# Patient Record
Sex: Male | Born: 1970 | Race: Black or African American | Hispanic: No | Smoking: Current every day smoker
Health system: Southern US, Community
[De-identification: ages and names within clinical notes are randomized; demographics above are authoritative.]

## PROBLEM LIST (undated history)

## (undated) DIAGNOSIS — K219 Gastro-esophageal reflux disease without esophagitis: Secondary | ICD-10-CM

---

## 2016-05-11 ENCOUNTER — Ambulatory Visit (INDEPENDENT_AMBULATORY_CARE_PROVIDER_SITE_OTHER): Payer: Worker's Compensation

## 2016-05-11 ENCOUNTER — Ambulatory Visit (INDEPENDENT_AMBULATORY_CARE_PROVIDER_SITE_OTHER): Payer: Worker's Compensation | Admitting: Urgent Care

## 2016-05-11 VITALS — BP 128/78 | HR 67 | Temp 98.3°F | Ht 71.0 in | Wt 174.0 lb

## 2016-05-11 DIAGNOSIS — M79671 Pain in right foot: Secondary | ICD-10-CM | POA: Diagnosis not present

## 2016-05-11 DIAGNOSIS — S9031XA Contusion of right foot, initial encounter: Secondary | ICD-10-CM | POA: Diagnosis not present

## 2016-05-11 DIAGNOSIS — S99921A Unspecified injury of right foot, initial encounter: Secondary | ICD-10-CM

## 2016-05-11 DIAGNOSIS — M25474 Effusion, right foot: Secondary | ICD-10-CM

## 2016-05-11 DIAGNOSIS — Z026 Encounter for examination for insurance purposes: Secondary | ICD-10-CM

## 2016-05-11 MED ORDER — NAPROXEN SODIUM 550 MG PO TABS: 550.0000 mg | ORAL_TABLET | Freq: Two times a day (BID) | ORAL | 1 refills | Status: DC

## 2016-05-11 NOTE — Progress Notes (Signed)
    MRN: [id number] DOB: 12/17/1970  Subjective:   Calvin Bernard is a 46 y.o. male presenting for chief complaint of Foot Injury (right, x 3 days)  Reports 3 day history of right foot injury while at work. 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 However, it fell onto his right foot. He has since had pain, swelling, difficulty bearing weight. He has used some wrapping to help with the pain. Denies bony deformity, bruising.   Savir has a current medications list, allergies, pmh, psh that were updated as appropriate and not included due to being a worker's compensation claim.  Objective:   Vitals: BP 128/78 (BP Location: Right Arm, Patient Position: Sitting, Cuff Size: Normal)   Pulse 67   Temp 98.3 F (36.8 C) (Oral)   Ht 5\' 11"  (1.803 m)   Wt 174 lb (78.9 kg)   SpO2 98%   BMI 24.27 kg/m   Physical Exam  Constitutional: He is oriented to person, place, and time. He appears well-developed and well-nourished.  Cardiovascular: Normal rate.   Pulmonary/Chest: Effort normal.  Musculoskeletal:       Right foot: There is decreased range of motion (dorsi and planter flexion of his foot), tenderness (over area depicted) and swelling (trace edema over area depicted). There is no bony tenderness, normal capillary refill, no crepitus, no deformity and no laceration.       Feet:  Neurological: He is alert and oriented to person, place, and time.   Dg Foot Complete Right  Result Date: 05/11/2016 CLINICAL DATA:  Right foot injury.  No time course given. EXAM: RIGHT FOOT COMPLETE - 3+ VIEW COMPARISON:  None FINDINGS: The joint spaces are maintained. No acute bony findings or significant degenerative changes. IMPRESSION: No acute bony findings. Electronically Signed   By: Rudie MeyerP.  Gallerani M.D.   On: 05/11/2016 17:09   Assessment and Plan :   1. Encounter related to worker's compensation claim 2. Contusion of right foot, initial encounter 3. Injury of right  foot, initial encounter 4. Swelling of foot joint, right 5. Right foot pain - Will manage conservatively. Work restrictions provided. RTC in 1 week for f/u.  Wallis BambergMario Vasiliki Smaldone, PA-C [facility] [phone] 05/11/2016 4:38 PM

## 2016-05-11 NOTE — Patient Instructions (Addendum)
Contusion A contusion is a deep bruise. Contusions are the result of a blunt injury to tissues and muscle fibers under the skin. The injury causes bleeding under the skin. The skin overlying the contusion may turn blue, purple, or yellow. Minor injuries will give you a painless contusion, but more severe contusions may stay painful and swollen for a few weeks. What are the causes? This condition is usually caused by a blow, trauma, or direct force to an area of the body. What are the signs or symptoms? Symptoms of this condition include:  Swelling of the injured area.  Pain and tenderness in the injured area.  Discoloration. The area may have redness and then turn blue, purple, or yellow.  How is this diagnosed? This condition is diagnosed based on a physical exam and medical history. An X-ray, CT scan, or MRI may be needed to determine if there are any associated injuries, such as broken bones (fractures). How is this treated? Specific treatment for this condition depends on what area of the body was injured. In general, the best treatment for a contusion is resting, icing, applying pressure to (compression), and elevating the injured area. This is often called the RICE strategy. Over-the-counter anti-inflammatory medicines may also be recommended for pain control. Follow these instructions at home:  Rest the injured area.  If directed, apply ice to the injured area: ? Put ice in a plastic bag. ? Place a towel between your skin and the bag. ? Leave the ice on for 20 minutes, 2-3 times per day.  If directed, apply light compression to the injured area using an elastic bandage. Make sure the bandage is not wrapped too tightly. Remove and reapply the bandage as directed by your health care provider.  If possible, raise (elevate) the injured area above the level of your heart while you are sitting or lying down.  Take over-the-counter and prescription medicines only as told by your health  care provider. Contact a health care provider if:  Your symptoms do not improve after several days of treatment.  Your symptoms get worse.  You have difficulty moving the injured area. Get help right away if:  You have severe pain.  You have numbness in a hand or foot.  Your hand or foot turns pale or cold. This information is not intended to replace advice given to you by your health care provider. Make sure you discuss any questions you have with your health care provider. Document Released: 02/03/2005 Document Revised: 09/04/2015 Document Reviewed: 09/11/2014 Elsevier Interactive Patient Education  2017 Elsevier Inc.     IF you received an x-ray today, you will receive an invoice from [facility]. Please contact [facility] at [phone] with questions or concerns regarding your invoice.   IF you received labwork today, you will receive an invoice from [facility]. Please contact [facility] at [phone] with questions or concerns regarding your invoice.   Our billing staff will not be able to assist you with questions regarding bills from these companies.  You will be contacted with the lab results as soon as they are available. The fastest way to get your results is to activate your My Chart account. Instructions are located on the last page of this paperwork. If you have not heard from us regarding the results in 2 weeks, please contact this office.      

## 2016-05-19 ENCOUNTER — Ambulatory Visit (INDEPENDENT_AMBULATORY_CARE_PROVIDER_SITE_OTHER): Payer: Worker's Compensation | Admitting: Urgent Care

## 2016-05-19 VITALS — BP 128/80 | HR 67 | Temp 98.7°F | Resp 17 | Ht 71.5 in | Wt 174.0 lb

## 2016-05-19 DIAGNOSIS — M25474 Effusion, right foot: Secondary | ICD-10-CM

## 2016-05-19 DIAGNOSIS — M79671 Pain in right foot: Secondary | ICD-10-CM | POA: Diagnosis not present

## 2016-05-19 DIAGNOSIS — S9031XA Contusion of right foot, initial encounter: Secondary | ICD-10-CM

## 2016-05-19 DIAGNOSIS — S9031XD Contusion of right foot, subsequent encounter: Secondary | ICD-10-CM | POA: Diagnosis not present

## 2016-05-19 DIAGNOSIS — Z026 Encounter for examination for insurance purposes: Secondary | ICD-10-CM

## 2016-05-19 MED ORDER — NAPROXEN SODIUM 550 MG PO TABS: 550.0000 mg | ORAL_TABLET | Freq: Two times a day (BID) | ORAL | 1 refills | Status: DC

## 2016-05-19 NOTE — Patient Instructions (Addendum)
If you are unable to wear your work shoe or cannot tolerate your pain tomorrow after a work a shift, then come back for a recheck.   Contusion Introduction A contusion is a deep bruise. Contusions happen when an injury causes bleeding under the skin. Symptoms of bruising include pain, swelling, and discolored skin. The skin may turn blue, purple, or yellow. Follow these instructions at home:  Rest the injured area.  If told, put ice on the injured area.  Put ice in a plastic bag.  Place a towel between your skin and the bag.  Leave the ice on for 20 minutes, 2-3 times per day.  If told, put light pressure (compression) on the injured area using an elastic bandage. Make sure the bandage is not too tight. Remove it and put it back on as told by your doctor.  If possible, raise (elevate) the injured area above the level of your heart while you are sitting or lying down.  Take over-the-counter and prescription medicines only as told by your doctor. Contact a doctor if:  Your symptoms do not get better after several days of treatment.  Your symptoms get worse.  You have trouble moving the injured area. Get help right away if:  You have very bad pain.  You have a loss of feeling (numbness) in a hand or foot.  Your hand or foot turns pale or cold. This information is not intended to replace advice given to you by your health care provider. Make sure you discuss any questions you have with your health care provider. Document Released: 10/13/2007 Document Revised: 10/02/2015 Document Reviewed: 09/11/2014  2017 Elsevier    IF you received an x-ray today, you will receive an invoice from [facility]. Please contact [facility] at [phone] with questions or concerns regarding your invoice.   IF you received labwork today, you will receive an invoice from [facility]. Please contact [facility] at [phone] with questions or concerns regarding your invoice.   Our  billing staff will not be able to assist you with questions regarding bills from these companies.  You will be contacted with the lab results as soon as they are available. The fastest way to get your results is to activate your My Chart account. Instructions are located on the last page of this paperwork. If you have not heard from us regarding the results in 2 weeks, please contact this office.

## 2016-05-19 NOTE — Progress Notes (Signed)
    MRN: [id number] DOB: 10/01/1970  Subjective:   Calvin Bernard is a 46 y.o. male presenting for worker's compensation follow up visit. His x-ray was negative on 05/11/2016, managed for foot contusion conservatively with work restrictions. Has not tried Anaprox. Admits that he has been resting his foot and trying to maneuver his foot the way he would at work but has been unsuccessful. He also cannot put his foot into his work shoe due to swelling. He now feels tightness in his arch and right hip. Denies trauma, redness, popping sensations, bony deformity.   Cyrus's medications list, allergies, past medical history and past surgical history were reviewed and excluded from this note due to being a worker's comp case.  Objective:   Vitals: BP 128/80 (BP Location: Right Arm, Patient Position: Sitting, Cuff Size: Normal)   Pulse 67   Temp 98.7 F (37.1 C) (Oral)   Resp 17   Ht 5' 11.5" (1.816 m)   Wt 174 lb (78.9 kg)   SpO2 98%   BMI 23.93 kg/m   Physical Exam  Constitutional: He is oriented to person, place, and time. He appears well-developed and well-nourished.  Cardiovascular: Normal rate.   Pulmonary/Chest: Effort normal.  Musculoskeletal:       Right foot: There is tenderness (over area outlined) and swelling. There is normal range of motion, no bony tenderness, normal capillary refill, no crepitus, no deformity and no laceration.       Feet:  Right foot measures 23.4cm across first metatarsal and 22.3cm for left foot.  Neurological: He is alert and oriented to person, place, and time.   Dg Foot Complete Right  Result Date: 05/11/2016 CLINICAL DATA:  Right foot injury.  No time course given. EXAM: RIGHT FOOT COMPLETE - 3+ VIEW COMPARISON:  None FINDINGS: The joint spaces are maintained. No acute bony findings or significant degenerative changes. IMPRESSION: No acute bony findings. Electronically Signed   By: Rudie MeyerP.  Gallerani M.D.   On: 05/11/2016 17:09   Assessment and Plan :   1.  Encounter related to worker's compensation claim 2. Contusion of right foot, initial encounter 3. Right foot pain 4. Swelling of foot joint, right - Start Anaprox for foot pain and swelling as recommended previously, patient agreed to pick up his script. Work restrictions written for half days. Patient will f/u on 05/22/2016 or sooner if he is unable to tolerate half day at work or use his work shoe.  Wallis BambergMario Ceazia Harb, PA-C [facility] [phone] 05/19/2016 2:31 PM

## 2016-05-20 ENCOUNTER — Ambulatory Visit (INDEPENDENT_AMBULATORY_CARE_PROVIDER_SITE_OTHER): Payer: Worker's Compensation | Admitting: Urgent Care

## 2016-05-20 VITALS — BP 118/64 | HR 66 | Temp 98.3°F | Resp 14 | Ht 71.5 in | Wt 177.0 lb

## 2016-05-20 DIAGNOSIS — Z026 Encounter for examination for insurance purposes: Secondary | ICD-10-CM

## 2016-05-20 DIAGNOSIS — M25474 Effusion, right foot: Secondary | ICD-10-CM

## 2016-05-20 DIAGNOSIS — S9031XD Contusion of right foot, subsequent encounter: Secondary | ICD-10-CM | POA: Diagnosis not present

## 2016-05-20 DIAGNOSIS — M79671 Pain in right foot: Secondary | ICD-10-CM

## 2016-05-20 DIAGNOSIS — S9031XA Contusion of right foot, initial encounter: Secondary | ICD-10-CM

## 2016-05-20 MED ORDER — MELOXICAM 15 MG PO TABS: 7.5000 mg | ORAL_TABLET | Freq: Every day | ORAL | 1 refills | Status: DC

## 2016-05-20 NOTE — Patient Instructions (Addendum)
Use Tylenol 500mg  tonight for pain and inflammation. Try 7.5-15mg  meloxicam based on your pain level and side effects. If you end up needing 15mg  but have already taken 7.5mg , wait til the next day to use 15mg  and use 500mg  of Tylenol that night.  Meloxicam tablets What is this medicine? MELOXICAM (mel OX i cam) is a non-steroidal anti-inflammatory drug (NSAID). It is used to reduce swelling and to treat pain. It may be used for osteoarthritis, rheumatoid arthritis, or juvenile rheumatoid arthritis. This medicine may be used for other purposes; ask your health care provider or pharmacist if you have questions. COMMON BRAND NAME(S): Mobic What should I tell my health care provider before I take this medicine? They need to know if you have any of these conditions: -bleeding disorders -cigarette smoker -coronary artery bypass graft (CABG) surgery within the past 2 weeks -drink more than 3 alcohol-containing drinks per day -heart disease -high blood pressure -history of stomach bleeding -kidney disease -liver disease -lung or breathing disease, like asthma -stomach or intestine problems -an unusual or allergic reaction to meloxicam, aspirin, other NSAIDs, other medicines, foods, dyes, or preservatives -pregnant or trying to get pregnant -breast-feeding How should I use this medicine? Take this medicine by mouth with a full glass of water. Follow the directions on the prescription label. You can take it with or without food. If it upsets your stomach, take it with food. Take your medicine at regular intervals. Do not take it more often than directed. Do not stop taking except on your doctor's advice. A special MedGuide will be given to you by the pharmacist with each prescription and refill. Be sure to read this information carefully each time. Talk to your pediatrician regarding the use of this medicine in children. While this drug may be prescribed for selected conditions, precautions do  apply. Patients over 36 years old may have a stronger reaction and need a smaller dose. Overdosage: If you think you have taken too much of this medicine contact a poison control center or emergency room at once. NOTE: This medicine is only for you. Do not share this medicine with others. What if I miss a dose? If you miss a dose, take it as soon as you can. If it is almost time for your next dose, take only that dose. Do not take double or extra doses. What may interact with this medicine? Do not take this medicine with any of the following medications: -cidofovir -ketorolac This medicine may also interact with the following medications: -aspirin and aspirin-like medicines -certain medicines for blood pressure, heart disease, irregular heart beat -certain medicines for depression, anxiety, or psychotic disturbances -certain medicines that treat or prevent blood clots like warfarin, enoxaparin, dalteparin, apixaban, dabigatran, rivaroxaban -cyclosporine -digoxin -diuretics -methotrexate -other NSAIDs, medicines for pain and inflammation, like ibuprofen and naproxen -pemetrexed This list may not describe all possible interactions. Give your health care provider a list of all the medicines, herbs, non-prescription drugs, or dietary supplements you use. Also tell them if you smoke, drink alcohol, or use illegal drugs. Some items may interact with your medicine. What should I watch for while using this medicine? Tell your doctor or healthcare professional if your symptoms do not start to get better or if they get worse. Do not take other medicines that contain aspirin, ibuprofen, or naproxen with this medicine. Side effects such as stomach upset, nausea, or ulcers may be more likely to occur. Many medicines available without a prescription should not be taken with  this medicine. This medicine can cause ulcers and bleeding in the stomach and intestines at any time during treatment. This can  happen with no warning and may cause death. There is increased risk with taking this medicine for a long time. Smoking, drinking alcohol, older age, and poor health can also increase risks. Call your doctor right away if you have stomach pain or blood in your vomit or stool. This medicine does not prevent heart attack or stroke. In fact, this medicine may increase the chance of a heart attack or stroke. The chance may increase with longer use of this medicine and in people who have heart disease. If you take aspirin to prevent heart attack or stroke, talk with your doctor or health care professional. What side effects may I notice from receiving this medicine? Side effects that you should report to your doctor or health care professional as soon as possible: -allergic reactions like skin rash, itching or hives, swelling of the face, lips, or tongue -nausea, vomiting -signs and symptoms of a blood clot such as breathing problems; changes in vision; chest pain; severe, sudden headache; pain, swelling, warmth in the leg; trouble speaking; sudden numbness or weakness of the face, arm, or leg -signs and symptoms of bleeding such as bloody or black, tarry stools; red or dark-brown urine; spitting up blood or brown material that looks like coffee grounds; red spots on the skin; unusual bruising or bleeding from the eye, gums, or nose -signs and symptoms of liver injury like dark yellow or brown urine; general ill feeling or flu-like symptoms; light-colored stools; loss of appetite; nausea; right upper belly pain; unusually weak or tired; yellowing of the eyes or skin -signs and symptoms of stroke like changes in vision; confusion; trouble speaking or understanding; severe headaches; sudden numbness or weakness of the face, arm, or leg; trouble walking; dizziness; loss of balance or coordination Side effects that usually do not require medical attention (report to your doctor or health care professional if they  continue or are bothersome): -constipation -diarrhea -gas This list may not describe all possible side effects. Call your doctor for medical advice about side effects. You may report side effects to FDA at 1-800-FDA-1088. Where should I keep my medicine? Keep out of the reach of children. Store at room temperature between 15 and 30 degrees C (59 and 86 degrees F). Throw away any unused medicine after the expiration date. NOTE: This sheet is a summary. It may not cover all possible information. If you have questions about this medicine, talk to your doctor, pharmacist, or health care provider.  2017 Elsevier/Gold Standard (2015-05-28 19:28:16)     IF you received an x-ray today, you will receive an invoice from [facility]. Please contact [facility] at [phone] with questions or concerns regarding your invoice.   IF you received labwork today, you will receive an invoice from [facility]. Please contact [facility] at [phone] with questions or concerns regarding your invoice.   Our billing staff will not be able to assist you with questions regarding bills from these companies.  You will be contacted with the lab results as soon as they are available. The fastest way to get your results is to activate your My Chart account. Instructions are located on the last page of this paperwork. If you have not heard from [place] regarding the results in 2 weeks, please contact this office.

## 2016-05-20 NOTE — Progress Notes (Signed)
    MRN: [id number] DOB: 09/12/1970  Subjective:   Calvin Bernard is a 46 y.o. male presenting for follow up on right foot contusion. He was last seen yesterday 05/19/2016, started Anaprox. He went to work today with some light duties. 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 Otherwise, he used the [place] with ease. Lifting heavy items also elicited some foot pain. However, since starting Anaprox, has taken 2 doses, he has had left eye twitching. Denies blurred vision, photosensitivity, rashes. Denies weakness of his foot, redness, warmth.  Calvin Bernard has current medications list, allergies, pmh, psh that were updated as appropriate and not included due to being a worker's compensation claim.   Objective:   Vitals: BP 118/64   Pulse 66   Temp 98.3 F (36.8 C) (Oral)   Resp 14   Ht 5' 11.5" (1.816 m)   Wt 177 lb (80.3 kg)   SpO2 98%   BMI 24.34 kg/m   Physical Exam  Constitutional: He is oriented to person, place, and time. He appears well-developed and well-nourished.  Cardiovascular: Normal rate.   Pulmonary/Chest: Effort normal.  Musculoskeletal:       Right foot: There is decreased range of motion (dorsiflexion), tenderness and swelling (trace). There is no bony tenderness, normal capillary refill, no crepitus, no deformity and no laceration.       Feet:  Distal portion of foot around 1st metatarsal where patient has pain measures 22.7cm for left and 23.2cm for right.  Neurological: He is alert and oriented to person, place, and time.   Assessment and Plan :   1. Encounter related to worker's compensation claim 2. Contusion of right foot, initial encounter 3. Right foot pain 4. Swelling of foot joint, right - Stop Anaprox. Use APAP and meloxicam. I will maintain patient's 1/2 work schedule. Recheck in 1 week or sooner if patient's pain persists.  Wallis BambergMario Nanda Bittick, PA-C [facility] [phone] 05/20/2016 5:14 PM

## 2016-05-25 ENCOUNTER — Telehealth: Payer: Self-pay | Admitting: Family Medicine

## 2016-05-25 NOTE — Telephone Encounter (Signed)
SOMEONE FROM WORK[employer] INS[employer] WANTED PT DRUG SCREEN RESULTS AND A COPY OF LAST LETTER TO RETURN TO WORK

## 2016-06-07 ENCOUNTER — Ambulatory Visit (INDEPENDENT_AMBULATORY_CARE_PROVIDER_SITE_OTHER): Payer: Worker's Compensation | Admitting: Emergency Medicine

## 2016-06-07 ENCOUNTER — Ambulatory Visit (INDEPENDENT_AMBULATORY_CARE_PROVIDER_SITE_OTHER): Payer: Worker's Compensation

## 2016-06-07 VITALS — BP 92/60 | HR 66 | Temp 98.1°F | Resp 18 | Ht 71.5 in | Wt 175.0 lb

## 2016-06-07 DIAGNOSIS — M79672 Pain in left foot: Secondary | ICD-10-CM

## 2016-06-07 DIAGNOSIS — S99922A Unspecified injury of left foot, initial encounter: Secondary | ICD-10-CM

## 2016-06-07 DIAGNOSIS — S9032XA Contusion of left foot, initial encounter: Secondary | ICD-10-CM

## 2016-06-07 NOTE — Patient Instructions (Signed)
Foot Pain Introduction Many things can cause foot pain. Some common causes are:  An injury.  A sprain.  Arthritis.  Blisters.  Bunions. Follow these instructions at home: Pay attention to any changes in your symptoms. Take these actions to help with your discomfort:  If directed, put ice on the affected area:  Put ice in a plastic bag.  Place a towel between your skin and the bag.  Leave the ice on for 15-20 minutes, 3?4 times a day for 2 days.  Take over-the-counter and prescription medicines only as told by your health care provider.  Wear comfortable, supportive shoes that fit you well. Do not wear high heels.  Do not stand or walk for long periods of time.  Do not lift a lot of weight. This can put added pressure on your feet.  Do stretches to relieve foot pain and stiffness as told by your health care provider.  Rub your foot gently.  Keep your feet clean and dry. Contact a health care provider if:  Your pain does not get better after a few days of self-care.  Your pain gets worse.  You cannot stand on your foot. Get help right away if:  Your foot is numb or tingling.  Your foot or toes are swollen.  Your foot or toes turn white or blue.  You have warmth and redness along your foot. This information is not intended to replace advice given to you by your health care provider. Make sure you discuss any questions you have with your health care provider. Document Released: 05/23/2015 Document Revised: 10/02/2015 Document Reviewed: 05/22/2014  2017 Elsevier  

## 2016-06-07 NOTE — Progress Notes (Signed)
Calvin Bernard 46 y.o.   Chief Complaint  Patient presents with  . Follow-up    Workers comp    HISTORY OF PRESENT ILLNESS: This is a 46 y.o. male complaining of pain to left foot after sustaining injury at work.  Foot Injury   The incident occurred more than 1 week ago (1 month ago). The incident occurred at work. The injury mechanism was a direct blow. The pain is present in the left foot. The quality of the pain is described as aching. The pain is at a severity of 5/10. The pain is moderate. The pain has been constant since onset. Associated symptoms include an inability to bear weight. Pertinent negatives include no loss of sensation, muscle weakness, numbness or tingling. The symptoms are aggravated by movement and weight bearing. He has tried ice, acetaminophen and NSAIDs for the symptoms. The treatment provided mild relief.     Prior to Admission medications   Medication Sig Start Date End Date Taking? Authorizing Provider  meloxicam (MOBIC) 15 MG tablet Take 0.5-1 tablets (7.5-15 mg total) by mouth daily. 05/20/16  Yes Wallis BambergMario Mani, PA-C    No Known Allergies  There are no active problems to display for this patient.   History reviewed. No pertinent past medical history.  History reviewed. No pertinent surgical history.  Social History   Social History  . Marital status: Unknown    Spouse name: N/A  . Number of children: N/A  . Years of education: N/A   Occupational History  . Not on file.   Social History Main Topics  . Smoking status: Current Every Day Smoker  . Smokeless tobacco: Never Used  . Alcohol use Not on file  . Drug use: Unknown  . Sexual activity: Not on file   Other Topics Concern  . Not on file   Social History Narrative  . No narrative on file    History reviewed. No pertinent family history.   Review of Systems  Constitutional: Negative for chills and fever.  HENT: Negative.   Eyes: Negative.   Respiratory: Negative.     Cardiovascular: Negative.   Gastrointestinal: Negative for nausea and vomiting.  Musculoskeletal: Negative for back pain and neck pain.       Left foot pain  Skin: Negative for rash.  Neurological: Negative for dizziness, tingling, numbness and headaches.  Endo/Heme/Allergies: Does not bruise/bleed easily.  All other systems reviewed and are negative.  Vitals:   06/07/16 1417  BP: 92/60  Pulse: 66  Resp: 18  Temp: 98.1 F (36.7 C)     Physical Exam  Constitutional: He is oriented to person, place, and time. He appears well-developed and well-nourished.  HENT:  Head: Normocephalic and atraumatic.  Eyes: Pupils are equal, round, and reactive to light.  Neck: Normal range of motion. Neck supple.  Cardiovascular: Normal rate.   Pulmonary/Chest: Effort normal.  Musculoskeletal:  Left foot: +tenderness and mild swelling lateral aspect; NVI; ambulatory.  Neurological: He is alert and oriented to person, place, and time.  Skin: Skin is warm and dry. Capillary refill takes less than 2 seconds.  Psychiatric: He has a normal mood and affect. His behavior is normal.  Vitals reviewed.  Xray reviewed: no Fx  ASSESSMENT & PLAN: Calvin Bernard was seen today for follow-up.  Diagnoses and all orders for this visit:  Left foot pain -     Ambulatory referral to Podiatry  Contusion of left foot, initial encounter  Foot injury, left, initial encounter -  DG Foot Complete Left; Future    Patient Instructions  Foot Pain Introduction Many things can cause foot pain. Some common causes are:  An injury.  A sprain.  Arthritis.  Blisters.  Bunions. Follow these instructions at home: Pay attention to any changes in your symptoms. Take these actions to help with your discomfort:  If directed, put ice on the affected area:  Put ice in a plastic bag.  Place a towel between your skin and the bag.  Leave the ice on for 15-20 minutes, 3?4 times a day for 2 days.  Take  over-the-counter and prescription medicines only as told by your health care provider.  Wear comfortable, supportive shoes that fit you well. Do not wear high heels.  Do not stand or walk for long periods of time.  Do not lift a lot of weight. This can put added pressure on your feet.  Do stretches to relieve foot pain and stiffness as told by your health care provider.  Rub your foot gently.  Keep your feet clean and dry. Contact a health care provider if:  Your pain does not get better after a few days of self-care.  Your pain gets worse.  You cannot stand on your foot. Get help right away if:  Your foot is numb or tingling.  Your foot or toes are swollen.  Your foot or toes turn white or blue.  You have warmth and redness along your foot. This information is not intended to replace advice given to you by your health care provider. Make sure you discuss any questions you have with your health care provider. Document Released: 05/23/2015 Document Revised: 10/02/2015 Document Reviewed: 05/22/2014  2017 Elsevier      Edwina Barth, MD [facility]

## 2017-01-26 ENCOUNTER — Emergency Department (HOSPITAL_COMMUNITY): Payer: Self-pay

## 2017-01-26 ENCOUNTER — Encounter (HOSPITAL_COMMUNITY): Payer: Self-pay | Admitting: Emergency Medicine

## 2017-01-26 ENCOUNTER — Emergency Department (HOSPITAL_COMMUNITY)
Admission: EM | Admit: 2017-01-26 | Discharge: 2017-01-26 | Disposition: A | Discharge status: Home / Self Care | Payer: Self-pay | Attending: Emergency Medicine | Admitting: Emergency Medicine

## 2017-01-26 DIAGNOSIS — K2100 Gastro-esophageal reflux disease with esophagitis, without bleeding: Secondary | ICD-10-CM

## 2017-01-26 DIAGNOSIS — F17200 Nicotine dependence, unspecified, uncomplicated: Secondary | ICD-10-CM | POA: Insufficient documentation

## 2017-01-26 DIAGNOSIS — Z79899 Other long term (current) drug therapy: Secondary | ICD-10-CM | POA: Insufficient documentation

## 2017-01-26 HISTORY — DX: Gastro-esophageal reflux disease without esophagitis: K21.9

## 2017-01-26 LAB — COMPREHENSIVE METABOLIC PANEL
ALK PHOS: 61 U/L (ref 38–126)
ALT: 17 U/L (ref 17–63)
ANION GAP: 10 (ref 5–15)
AST: 25 U/L (ref 15–41)
Albumin: 3.9 g/dL (ref 3.5–5.0)
BUN: 9 mg/dL (ref 6–20)
CALCIUM: 8.9 mg/dL (ref 8.9–10.3)
CO2: 22 mmol/L (ref 22–32)
Chloride: 108 mmol/L (ref 101–111)
Creatinine, Ser: 1 mg/dL (ref 0.61–1.24)
GFR calc non Af Amer: >60 mL/min (ref >60)
Glucose, Bld: 109 mg/dL — ABNORMAL HIGH (ref 65–99)
Potassium: 3.4 mmol/L — ABNORMAL LOW (ref 3.5–5.1)
SODIUM: 140 mmol/L (ref 135–145)
TOTAL PROTEIN: 6.6 g/dL (ref 6.5–8.1)
Total Bilirubin: 0.9 mg/dL (ref 0.3–1.2)

## 2017-01-26 LAB — CBC
HCT: 45.2 % (ref 39.0–52.0)
HEMOGLOBIN: 16.6 g/dL (ref 13.0–17.0)
MCH: 29.8 pg (ref 26.0–34.0)
MCHC: 36.7 g/dL — ABNORMAL HIGH (ref 30.0–36.0)
MCV: 81.1 fL (ref 78.0–100.0)
Platelets: 132 10*3/uL — ABNORMAL LOW (ref 150–400)
RBC: 5.57 MIL/uL (ref 4.22–5.81)
RDW: 13.6 % (ref 11.5–15.5)
WBC: 13.3 10*3/uL — AB (ref 4.0–10.5)

## 2017-01-26 LAB — LIPASE, BLOOD: LIPASE: 30 U/L (ref 11–51)

## 2017-01-26 MED ORDER — SODIUM CHLORIDE 0.9 % IV SOLN
INTRAVENOUS | Status: DC
  Administered 2017-01-26: 125 mL/h via INTRAVENOUS

## 2017-01-26 MED ORDER — ONDANSETRON HCL 4 MG/2ML IJ SOLN
4.0000 mg | Freq: Once | INTRAMUSCULAR | Status: AC
  Administered 2017-01-26: 4 mg via INTRAVENOUS
  Filled 2017-01-26: qty 2

## 2017-01-26 MED ORDER — SUCRALFATE 1 G PO TABS: 1.0000 g | ORAL_TABLET | Freq: Four times a day (QID) | ORAL | 0 refills | Status: AC

## 2017-01-26 MED ORDER — ONDANSETRON 4 MG PO TBDP
4.0000 mg | ORAL_TABLET | Freq: Once | ORAL | Status: AC | PRN
  Administered 2017-01-26: 4 mg via ORAL
  Filled 2017-01-26: qty 1

## 2017-01-26 MED ORDER — IOPAMIDOL (ISOVUE-300) INJECTION 61%
INTRAVENOUS | Status: AC
  Filled 2017-01-26: qty 100

## 2017-01-26 MED ORDER — FAMOTIDINE 40 MG PO TABS: 40.0000 mg | ORAL_TABLET | Freq: Every day | ORAL | 0 refills | Status: AC

## 2017-01-26 MED ORDER — LORAZEPAM 2 MG/ML IJ SOLN
0.5000 mg | Freq: Once | INTRAMUSCULAR | Status: AC
  Administered 2017-01-26: 0.5 mg via INTRAVENOUS
  Filled 2017-01-26: qty 1

## 2017-01-26 MED ORDER — SODIUM CHLORIDE 0.9 % IV BOLUS (SEPSIS)
2000.0000 mL | Freq: Once | INTRAVENOUS | Status: AC
  Administered 2017-01-26: 2000 mL via INTRAVENOUS

## 2017-01-26 MED ORDER — IOPAMIDOL (ISOVUE-300) INJECTION 61%
100.0000 mL | Freq: Once | INTRAVENOUS | Status: AC | PRN
Start: 2017-01-26 — End: 2017-01-26
  Administered 2017-01-26: 100 mL via INTRAVENOUS

## 2017-01-26 MED ORDER — MORPHINE SULFATE (PF) 4 MG/ML IV SOLN
4.0000 mg | Freq: Once | INTRAVENOUS | Status: AC
  Administered 2017-01-26: 4 mg via INTRAVENOUS
  Filled 2017-01-26: qty 1

## 2017-01-26 NOTE — ED Triage Notes (Signed)
Per [facility] patient comes in for abd pain, n/v/d since been woke up this morning around 6am with these symptoms. Patient reports eating chicken parm and having acid reflux from acid in tomatoes.  Patient had one episode of Loose. Patient requesting, "i need something to make me throw up." informed patient that I can given him medication (ie Zofran) to help with nausea and vomiting.

## 2017-01-26 NOTE — ED Provider Notes (Signed)
[facility]-EMERGENCY DEPT Provider Note   CSN: [id number] Arrival date & time: 01/26/17  0930     History   Chief Complaint Chief Complaint  Patient presents with  . Abdominal Pain  . Emesis  . Diarrhea    HPI Calvin Bernard is a 46 y.o. male.  46 year old male presents with acute onset of epigastric and left upper quadrant abdominal pain that began early this morning. Does have a history of acid reflux and did have chickenpox on last night. He has had yellow emesis 3 today. No fever or chills. No black stools. No prior history of same. Pain is burning and does radiate to his back and is worse with any movement. Has not tried to eat since the symptoms started. Denies any urinary symptoms currently.      Past Medical History:  Diagnosis Date  . Acid reflux     There are no active problems to display for this patient.   History reviewed. No pertinent surgical history.     Home Medications    Prior to Admission medications   Medication Sig Start Date End Date Taking? Authorizing Provider  meloxicam (MOBIC) 15 MG tablet Take 0.5-1 tablets (7.5-15 mg total) by mouth daily. 05/20/16   Wallis Bamberg, PA-C    Family History No family history on file.  Social History Social History  Substance Use Topics  . Smoking status: Current Every Day Smoker  . Smokeless tobacco: Never Used  . Alcohol use No     Allergies   Patient has no known allergies.   Review of Systems Review of Systems  All other systems reviewed and are negative.    Physical Exam Updated Vital Signs There were no vitals taken for this visit.  Physical Exam  Constitutional: He is oriented to person, place, and time. He appears well-developed and well-nourished.  Non-toxic appearance. No distress.  HENT:  Head: Normocephalic and atraumatic.  Eyes: Pupils are equal, round, and reactive to light. Conjunctivae, EOM and lids are normal.  Neck: Normal range of motion. Neck supple. No tracheal deviation  present. No thyroid mass present.  Cardiovascular: Normal rate, regular rhythm and normal heart sounds.  Exam reveals no gallop.   No murmur heard. Pulmonary/Chest: Effort normal and breath sounds normal. No stridor. No respiratory distress. He has no decreased breath sounds. He has no wheezes. He has no rhonchi. He has no rales.  Abdominal: Soft. Normal appearance and bowel sounds are normal. He exhibits no distension. There is tenderness in the epigastric area and left upper quadrant. There is no rebound and no CVA tenderness.    Musculoskeletal: Normal range of motion. He exhibits no edema or tenderness.  Neurological: He is alert and oriented to person, place, and time. He has normal strength. No cranial nerve deficit or sensory deficit. GCS eye subscore is 4. GCS verbal subscore is 5. GCS motor subscore is 6.  Skin: Skin is warm and dry. No abrasion and no rash noted.  Psychiatric: He has a normal mood and affect. His speech is normal and behavior is normal.  Nursing note and vitals reviewed.    ED Treatments / Results  Labs (all labs ordered are listed, but only abnormal results are displayed) Labs Reviewed  LIPASE, BLOOD  COMPREHENSIVE METABOLIC PANEL  CBC  URINALYSIS, ROUTINE W REFLEX MICROSCOPIC    EKG  EKG Interpretation None       Radiology No results found.  Procedures Procedures (including critical care time)  Medications Ordered in ED Medications  sodium chloride 0.9 % bolus 2,000 mL (not administered)  0.9 %  sodium chloride infusion (not administered)  morphine 4 MG/ML injection 4 mg (not administered)  LORazepam (ATIVAN) injection 0.5 mg (not administered)  ondansetron (ZOFRAN) injection 4 mg (not administered)  ondansetron (ZOFRAN-ODT) disintegrating tablet 4 mg (4 mg Oral Given 01/26/17 1024)     Initial Impression / Assessment and Plan / ED Course  I have reviewed the triage vital signs and the nursing notes.  Pertinent labs & imaging results  that were available during my care of the patient were reviewed by me and considered in my medical decision making (see chart for details).    Patient treated for his symptoms and feels better. Abdominal CT without evidence of perforation. Suspect GERD and will discharge home   Final Clinical Impressions(s) / ED Diagnoses   Final diagnoses:  None    New Prescriptions New Prescriptions   No medications on file     Lorre Nick, MD 01/26/17 [phone]

## 2017-10-17 IMAGING — CR DG ABDOMEN ACUTE W/ 1V CHEST
4 series · 4 of 4 positions shown · non-contrast
Comparison: None.

CLINICAL DATA: Abdominal pain with nausea and vomiting.

EXAM:
DG ABDOMEN ACUTE W/ 1V CHEST

[w chest pa]
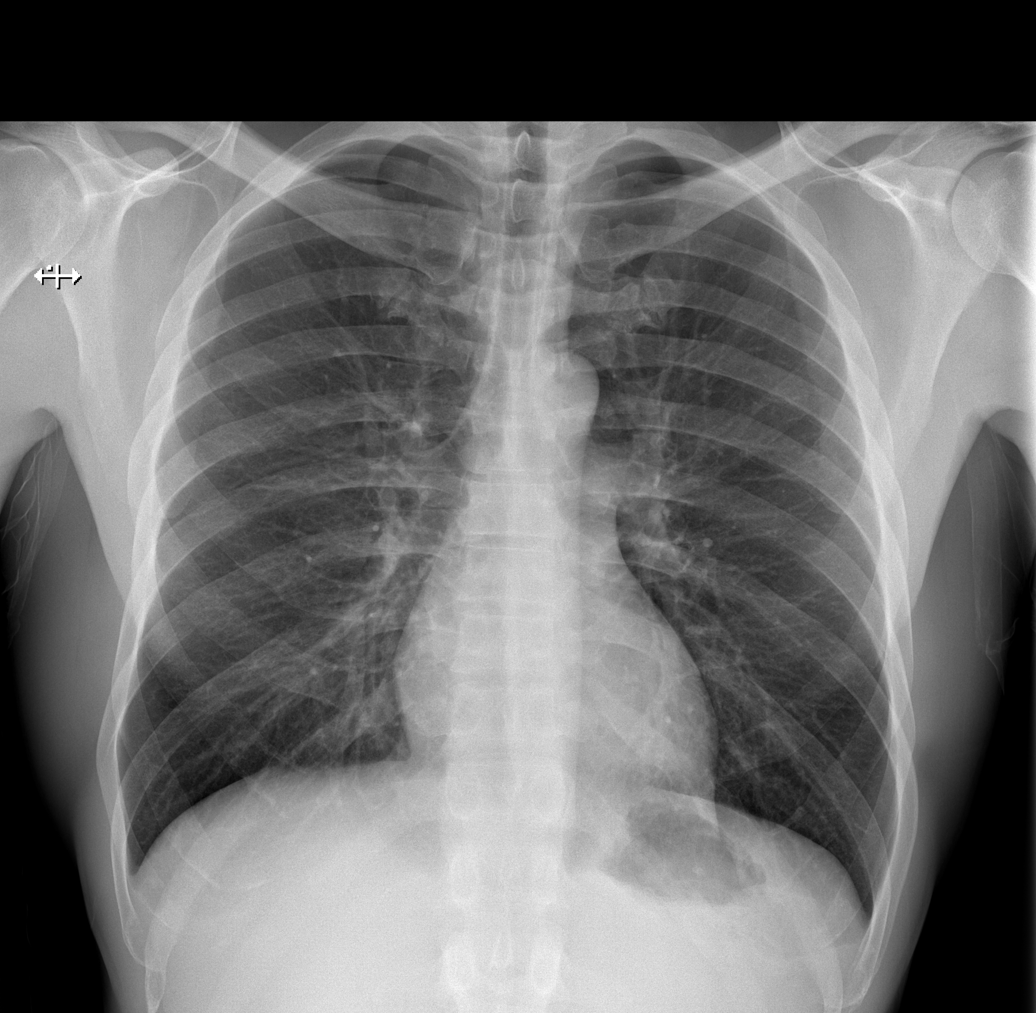

[w abdomen upright]
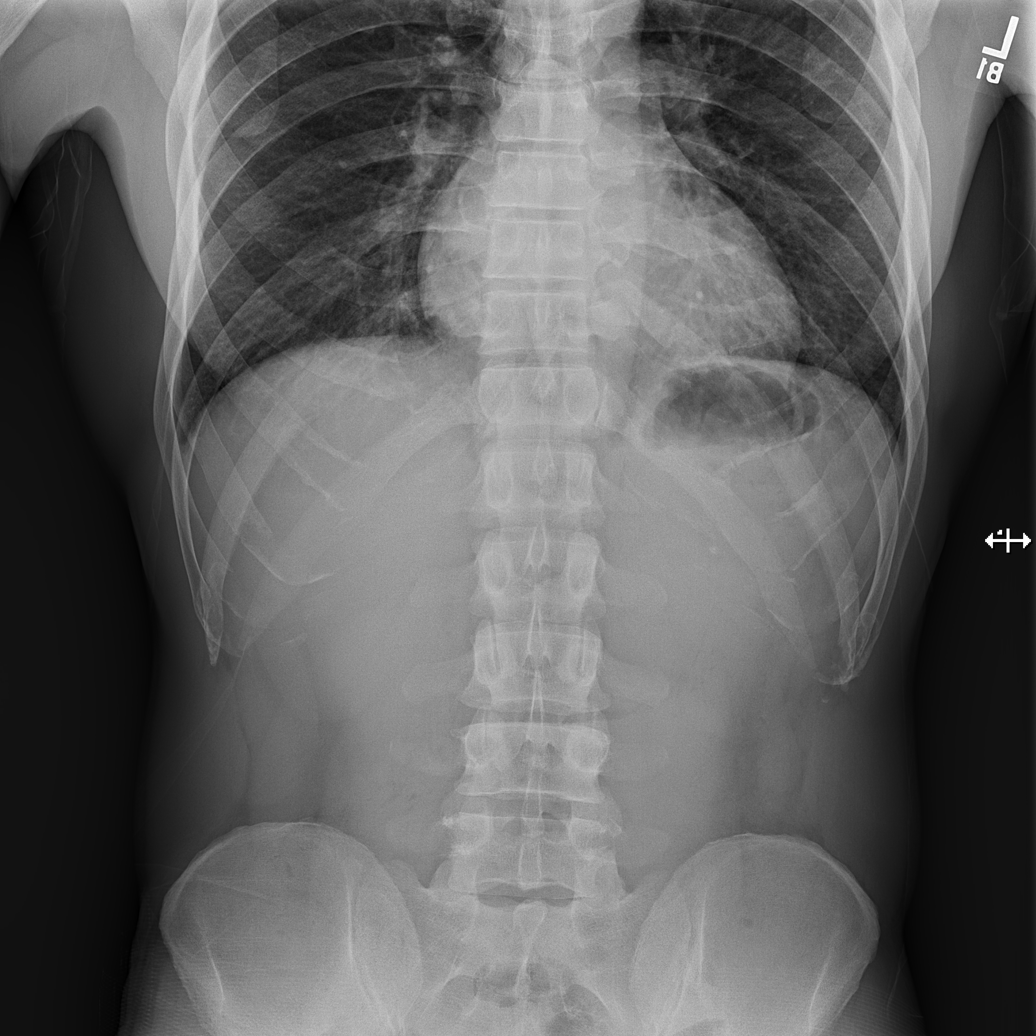

[t abdomen supine (1 of 2)]
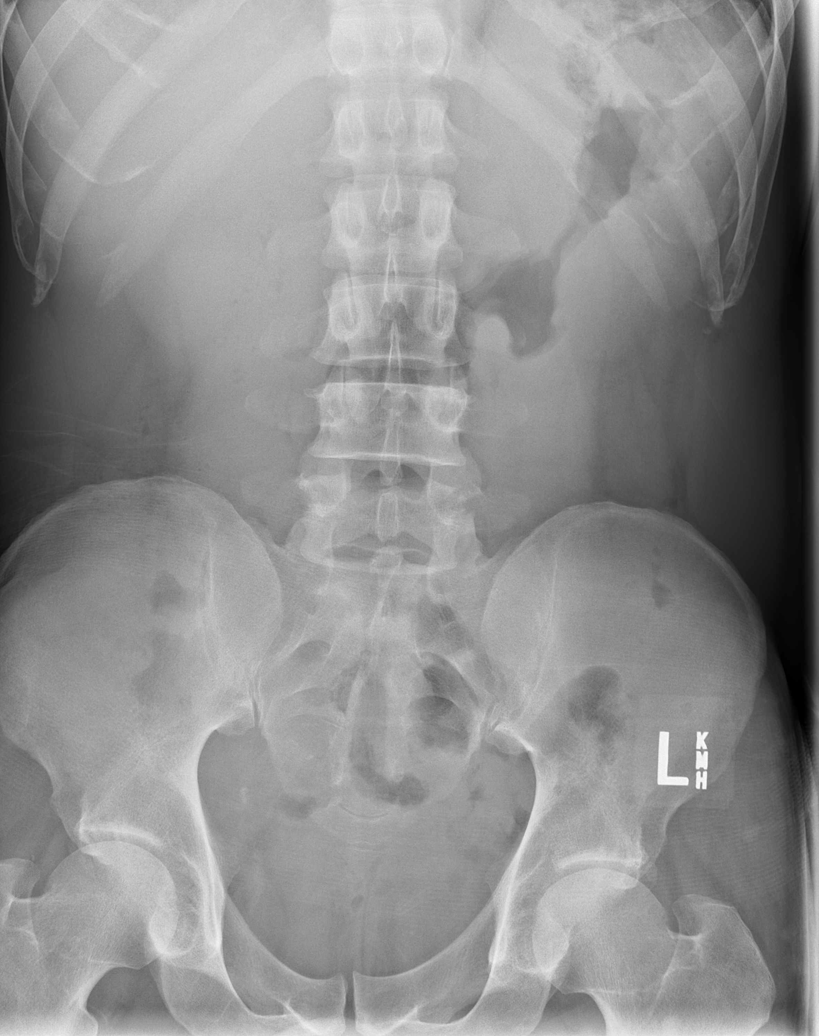

[t abdomen supine (2 of 2)]
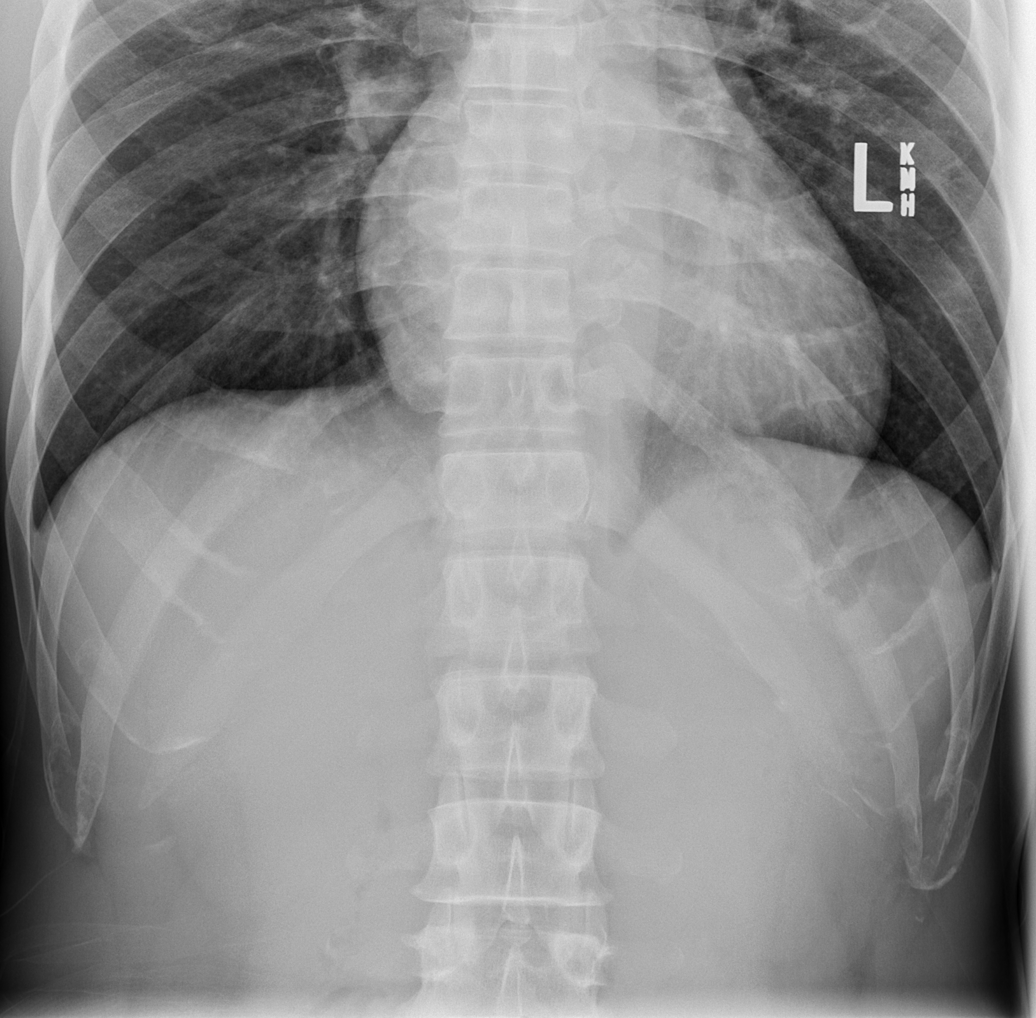

[4 of 4 positions shown; findings below may reference images not displayed]

FINDINGS: Normal heart size and mediastinal contours. No acute infiltrate or
edema. No effusion or pneumothorax. No osseous findings.

Normal bowel gas pattern. No pneumoperitoneum. Calcification in the
right pelvis likely a phlebolith. No reported flank pain or dysuria.
No concerning mass effect. No pneumoperitoneum.
IMPRESSION: Negative abdominal radiographs.  No acute cardiopulmonary disease.
